# Patient Record
Sex: Female | Born: 2005 | Race: White | Hispanic: No | Marital: Single | Smoking: Never smoker
Health system: Southern US, Community
[De-identification: ages and names within clinical notes are randomized; demographics above are authoritative.]

## PROBLEM LIST (undated history)

## (undated) HISTORY — PX: KNEE ARTHROSCOPY WITH PATELLAR TENDON REPAIR: SHX5656

---
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
## 2023-10-14 ENCOUNTER — Other Ambulatory Visit (HOSPITAL_BASED_OUTPATIENT_CLINIC_OR_DEPARTMENT_OTHER): Payer: Self-pay

## 2023-10-14 MED ORDER — LISDEXAMFETAMINE DIMESYLATE 20 MG PO CAPS
20.0000 mg | ORAL_CAPSULE | Freq: Every day | ORAL | 0 refills | Status: AC
  Filled 2023-10-14: qty 30, 30d supply, fill #0

## 2024-04-23 ENCOUNTER — Other Ambulatory Visit (HOSPITAL_BASED_OUTPATIENT_CLINIC_OR_DEPARTMENT_OTHER): Payer: Self-pay

## 2024-04-23 MED ORDER — LISDEXAMFETAMINE DIMESYLATE 30 MG PO CAPS
30.0000 mg | ORAL_CAPSULE | Freq: Every day | ORAL | 0 refills | Status: AC
  Filled 2024-04-23 – 2024-04-26 (×2): qty 30, 30d supply, fill #0

## 2024-04-26 ENCOUNTER — Other Ambulatory Visit (HOSPITAL_BASED_OUTPATIENT_CLINIC_OR_DEPARTMENT_OTHER): Payer: Self-pay

## 2024-04-26 ENCOUNTER — Other Ambulatory Visit: Payer: Self-pay

## 2024-04-30 ENCOUNTER — Other Ambulatory Visit (HOSPITAL_BASED_OUTPATIENT_CLINIC_OR_DEPARTMENT_OTHER): Payer: Self-pay
